# Patient Record
Sex: Male | Born: 2000
Health system: Southern US, Community
[De-identification: ages and names within clinical notes are randomized; demographics above are authoritative.]

## PROBLEM LIST (undated history)

## (undated) DIAGNOSIS — F909 Attention-deficit hyperactivity disorder, unspecified type: Secondary | ICD-10-CM

## (undated) DIAGNOSIS — J45909 Unspecified asthma, uncomplicated: Secondary | ICD-10-CM

## (undated) HISTORY — DX: Unspecified asthma, uncomplicated: J45.909

## (undated) HISTORY — DX: Attention-deficit hyperactivity disorder, unspecified type: F90.9

## (undated) HISTORY — PX: TONSILLECTOMY: SUR1361

---
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
## 2018-09-12 ENCOUNTER — Ambulatory Visit: Payer: BLUE CROSS/BLUE SHIELD | Admitting: Nurse Practitioner

## 2018-09-12 ENCOUNTER — Encounter: Payer: Self-pay | Admitting: Nurse Practitioner

## 2018-09-12 VITALS — BP 129/81 | HR 85 | Temp 97.6°F | Ht 71.0 in | Wt 279.0 lb

## 2018-09-12 DIAGNOSIS — J029 Acute pharyngitis, unspecified: Secondary | ICD-10-CM

## 2018-09-12 DIAGNOSIS — J069 Acute upper respiratory infection, unspecified: Secondary | ICD-10-CM | POA: Diagnosis not present

## 2018-09-12 LAB — RAPID STREP SCREEN (MED CTR MEBANE ONLY): Strep Gp A Ag, IA W/Reflex: NEGATIVE

## 2018-09-12 LAB — CULTURE, GROUP A STREP

## 2018-09-12 MED ORDER — AZITHROMYCIN 250 MG PO TABS
ORAL_TABLET | ORAL | 0 refills | Status: DC
Start: 2018-09-12 — End: 2020-05-07

## 2018-09-12 NOTE — Progress Notes (Signed)
Subjective:    Patient ID: Robert Phillips, male    DOB: [birth year], 18 y.o.   MRN: [id number]   Chief Complaint: sinus pressure  HPI Patient comes in today c/o sinus congestion and cough. Started about 4 days ago. No fever.   Review of Systems  Constitutional: Positive for chills. Negative for fever.  HENT: Positive for congestion, rhinorrhea and sore throat. Negative for ear pain and trouble swallowing.   Respiratory: Negative for cough.   Neurological: Positive for headaches.  All other systems reviewed and are negative.      Objective:   Physical Exam Constitutional:      General: He is not in acute distress.    Appearance: He is obese.  HENT:     Right Ear: Hearing, tympanic membrane, ear canal and external ear normal.     Left Ear: Hearing, tympanic membrane, ear canal and external ear normal.     Nose: Mucosal edema, congestion and rhinorrhea present.     Right Turbinates: Swollen and pale.     Left Turbinates: Swollen and pale.     Right Sinus: No maxillary sinus tenderness or frontal sinus tenderness.     Left Sinus: No maxillary sinus tenderness or frontal sinus tenderness.     Mouth/Throat:     Lips: Pink.     Mouth: Mucous membranes are moist.     Pharynx: Oropharynx is clear. Uvula midline.  Neck:     Musculoskeletal: Normal range of motion.  Cardiovascular:     Rate and Rhythm: Normal rate and regular rhythm.     Heart sounds: Normal heart sounds.  Pulmonary:     Effort: Pulmonary effort is normal.     Breath sounds: Normal breath sounds.  Lymphadenopathy:     Cervical: No cervical adenopathy.  Skin:    General: Skin is warm and dry.  Neurological:     General: No focal deficit present.     Mental Status: He is alert and oriented to person, place, and time.  Psychiatric:        Mood and Affect: Mood normal.        Behavior: Behavior normal.    BP 129/81   Pulse 85   Temp 97.6 F (36.4 C) (Oral)   Ht 5\' 11"  (1.803 m)   Wt 279 lb (126.6 kg)    BMI 38.91 kg/m   Strep negative     Assessment & Plan:  Alameen Misencik in today with chief complaint of Headache and Sore Throat   1. Sore throat - Rapid Strep Screen ([facility] ONLY)  2. Upper respiratory tract infection, unspecified type 1. Take meds as prescribed 2. Use a cool mist humidifier especially during the winter months and when heat has been humid. 3. Use saline nose sprays frequently 4. Saline irrigations of the nose can be very helpful if done frequently.  * 4X daily for 1 week*  * Use of a nettie pot can be helpful with this. Follow directions with this* 5. Drink plenty of fluids 6. Keep thermostat turn down low 7.For any cough or congestion  Use plain Mucinex- regular strength or max strength is fine   * Children- consult with Pharmacist for dosing 8. For fever or aces or pains- take tylenol or ibuprofen appropriate for age and weight.  * for fevers greater than 101 orally you may alternate ibuprofen and tylenol every  3 hours.   Meds ordered this encounter  Medications  . azithromycin (ZITHROMAX Z-PAK) 250 MG  tablet    Sig: As directed    Dispense:  6 tablet    Refill:  0    Order Specific Question:   Supervising Provider    Answer:   Nils Pyle [[id number]]   Mary-Margaret Daphine Deutscher, FNP

## 2018-09-12 NOTE — Patient Instructions (Signed)

## 2018-10-02 ENCOUNTER — Encounter: Payer: Self-pay | Admitting: Physician Assistant

## 2018-10-02 ENCOUNTER — Telehealth: Payer: Self-pay | Admitting: Physician Assistant

## 2018-10-02 DIAGNOSIS — J019 Acute sinusitis, unspecified: Secondary | ICD-10-CM

## 2018-10-02 MED ORDER — AMOXICILLIN-POT CLAVULANATE 875-125 MG PO TABS: 1.0000 | ORAL_TABLET | Freq: Two times a day (BID) | ORAL | 0 refills | Status: DC

## 2018-10-02 NOTE — Progress Notes (Signed)
We are sorry that you are not feeling well.  Here is how we plan to help!  Based on what you have shared with me it looks like you have sinusitis.  Sinusitis is inflammation and infection in the sinus cavities of the head.  Based on your presentation I believe you most likely have Acute Bacterial Sinusitis.  This is an infection caused by bacteria and is treated with antibiotics. I have prescribed Augmentin 875mg /125mg  one tablet twice daily with food, for 7 days.   You may use an oral decongestant such as Mucinex D or if you have glaucoma or high blood pressure use plain Mucinex. Saline nasal spray help and can safely be used as often as needed for congestion.  If you develop worsening sinus pain, fever or notice severe headache and vision changes, or if symptoms are not better after completion of antibiotic, please schedule an appointment with a health care provider.    Antibiotic supported by- According to record review, about one mouth ago had visit with PCP, diagnosed with URI and given Azithromycin. On this visit, he indicated nasal congestion and other nasal symptoms have not improved since that visit with PCP. Due to chornicity of symptoms, failure of Azithromycin, will RX antibitoicds  Sinus infections are not as easily transmitted as other respiratory infection, however we still recommend that you avoid close contact with loved ones, especially the very young and elderly.  Remember to wash your hands thoroughly throughout the day as this is the number one way to prevent the spread of infection!  Home Care:  Only take medications as instructed by your medical team.  Complete the entire course of an antibiotic.  Do not take these medications with alcohol.  A steam or ultrasonic humidifier can help congestion.  You can place a towel over your head and breathe in the steam from hot water coming from a faucet.  Avoid close contacts especially the very young and the elderly.  Cover your mouth  when you cough or sneeze.  Always remember to wash your hands.  Get Help Right Away If:  You develop worsening fever or sinus pain.  You develop a severe head ache or visual changes.  Your symptoms persist after you have completed your treatment plan.  Make sure you  Understand these instructions.  Will watch your condition.  Will get help right away if you are not doing well or get worse.  Your e-visit answers were reviewed by a board certified advanced clinical practitioner to complete your personal care plan.  Depending on the condition, your plan could have included both over the counter or prescription medications.  If there is a problem please reply  once you have received a response from your provider.  Your safety is important to [place].  If you have drug allergies check your prescription carefully.    You can use MyChart to ask questions about today's visit, request a non-urgent call back, or ask for a work or school excuse for 24 hours related to this e-Visit. If it has been greater than 24 hours you will need to follow up with your provider, or enter a new e-Visit to address those concerns.  You will get an e-mail in the next two days asking about your experience.  I hope that your e-visit has been valuable and will speed your recovery. Thank you for using e-visits.

## 2019-06-25 DIAGNOSIS — Z20828 Contact with and (suspected) exposure to other viral communicable diseases: Secondary | ICD-10-CM | POA: Diagnosis not present

## 2019-10-10 ENCOUNTER — Telehealth: Payer: Self-pay | Admitting: Nurse Practitioner

## 2019-10-10 ENCOUNTER — Encounter: Payer: Self-pay | Admitting: Family Medicine

## 2019-10-10 ENCOUNTER — Ambulatory Visit (INDEPENDENT_AMBULATORY_CARE_PROVIDER_SITE_OTHER): Payer: BC Managed Care – PPO | Admitting: Family Medicine

## 2019-10-10 DIAGNOSIS — J069 Acute upper respiratory infection, unspecified: Secondary | ICD-10-CM

## 2019-10-10 NOTE — Progress Notes (Signed)
Virtual Visit via telephone Note Due to COVID-19 pandemic this visit was conducted virtually. This visit type was conducted due to national recommendations for restrictions regarding the COVID-19 Pandemic (e.g. social distancing, sheltering in place) in an effort to limit this patient's exposure and mitigate transmission in our community. All issues noted in this document were discussed and addressed.  A physical exam was not performed with this format.   I connected with Robert Phillips on 10/10/2019 at 1345 by telephone and verified that I am speaking with the correct person using two identifiers. Robert Phillips is currently located at home and family is currently with them during visit. The provider, Kari Baars, FNP is located in their office at time of visit.  I discussed the limitations, risks, security and privacy concerns of performing an evaluation and management service by telephone and the availability of in person appointments. I also discussed with the patient that there may be a patient responsible charge related to this service. The patient expressed understanding and agreed to proceed.  Subjective:  Patient ID: Robert Phillips, male    DOB: April 13, 2001, 19 y.o.   MRN: [id number]  Chief Complaint:  URI   HPI: Robert Phillips is a 19 y.o. male presenting on 10/10/2019 for URI   URI  This is a recurrent problem. The current episode started in the past 7 days. The problem has been waxing and waning. The maximum temperature recorded prior to his arrival was 100.4 - 100.9 F. The fever has been present for 1 to 2 days. Associated symptoms include congestion, coughing, diarrhea, headaches, rhinorrhea and a sore throat. Pertinent negatives include no abdominal pain, chest pain, dysuria, ear pain, joint pain, joint swelling, nausea, neck pain, plugged ear sensation, rash, sinus pain, sneezing, swollen glands, vomiting or wheezing. He has tried acetaminophen for the symptoms. The treatment provided mild  relief.     Relevant past medical, surgical, family, and social history reviewed and updated as indicated.  Allergies and medications reviewed and updated.   Past Medical History:  Diagnosis Date  . ADHD (attention deficit hyperactivity disorder)   . Asthma     Past Surgical History:  Procedure Laterality Date  . TONSILLECTOMY      Social History   Socioeconomic History  . Marital status: Single    Spouse name: Not on file  . Number of children: Not on file  . Years of education: Not on file  . Highest education level: Not on file  Occupational History  . Not on file  Tobacco Use  . Smoking status: Passive Smoke Exposure - Never Smoker  . Smokeless tobacco: Never Used  Substance and Sexual Activity  . Alcohol use: Not on file  . Drug use: Not on file  . Sexual activity: Not on file  Other Topics Concern  . Not on file  Social History Narrative  . Not on file   Social Determinants of Health   Financial Resource Strain:   . Difficulty of Paying Living Expenses: Not on file  Food Insecurity:   . Worried About Programme researcher, broadcasting/film/video in the Last Year: Not on file  . Ran Out of Food in the Last Year: Not on file  Transportation Needs:   . Lack of Transportation (Medical): Not on file  . Lack of Transportation (Non-Medical): Not on file  Physical Activity:   . Days of Exercise per Week: Not on file  . Minutes of Exercise per Session: Not on file  Stress:   .  Feeling of Stress : Not on file  Social Connections:   . Frequency of Communication with Friends and Family: Not on file  . Frequency of Social Gatherings with Friends and Family: Not on file  . Attends Religious Services: Not on file  . Active Member of Clubs or Organizations: Not on file  . Attends Archivist Meetings: Not on file  . Marital Status: Not on file  Intimate Partner Violence:   . Fear of Current or Ex-Partner: Not on file  . Emotionally Abused: Not on file  . Physically Abused: Not  on file  . Sexually Abused: Not on file    Outpatient Encounter Medications as of 10/10/2019  Medication Sig  . amoxicillin-clavulanate (AUGMENTIN) 875-125 MG tablet Take 1 tablet by mouth 2 (two) times daily.  Marland Kitchen azithromycin (ZITHROMAX Z-PAK) 250 MG tablet As directed   No facility-administered encounter medications on file as of 10/10/2019.    No Known Allergies  Review of Systems  Constitutional: Positive for chills. Negative for activity change, appetite change, diaphoresis, fatigue, fever and unexpected weight change.  HENT: Positive for congestion, rhinorrhea and sore throat. Negative for ear pain, sinus pain and sneezing.   Eyes: Negative.  Negative for photophobia and visual disturbance.  Respiratory: Positive for cough. Negative for chest tightness, shortness of breath and wheezing.   Cardiovascular: Negative for chest pain, palpitations and leg swelling.  Gastrointestinal: Positive for diarrhea. Negative for abdominal pain, blood in stool, constipation, nausea and vomiting.  Endocrine: Negative.   Genitourinary: Negative for decreased urine volume, difficulty urinating, dysuria, frequency and urgency.  Musculoskeletal: Negative for arthralgias, joint pain, myalgias and neck pain.  Skin: Negative.  Negative for rash.  Allergic/Immunologic: Negative.   Neurological: Positive for headaches. Negative for dizziness, tremors, seizures, syncope, facial asymmetry, speech difficulty, weakness, light-headedness and numbness.  Hematological: Negative.   Psychiatric/Behavioral: Negative for confusion, hallucinations, sleep disturbance and suicidal ideas.  All other systems reviewed and are negative.        Observations/Objective: No vital signs or physical exam, this was a telephone or virtual health encounter.  Pt alert and oriented, answers all questions appropriately, and able to speak in full sentences.    Assessment and Plan: Tegh was seen today for uri.  Diagnoses and  all orders for this visit:  URI with cough and congestion Negative COVID-19 test. Viral syndrome. Symptomatic care discussed in detail.  This is a viral infection that cannot be treated with antibiotics. (Antibiotics are for bacterial infections, not viral infections.)  Viruses usually causes high fever during the first 3 days, followed by a gradual decrease in fever over the next 3-4 days. This infection will resolve through the body's defenses. Understand that fever is one of the body's primary defense mechanisms; an increased core body temperature (a fever) helps to kill germs. Symptomatic care at home should include the following:    Get plenty of rest.   Drink plenty of fluids (water, gatorade, or pedialyte), popsicles, and jello.   Take acetaminophen (Tylenol) or ibuprofen (Advil, Motrin) for fever or pain as needed.    For fever, you can also put a cool rag on the head and drink cold drinks.  A warm (not hot) bath will also be helpful.    Take honey for sore throat or to soothe an irritant cough. Can use sore throat sprays or lozenges.   Avoid spicy or acidic foods to minimize further throat irritation. For the same reason, avoid juices with citric acid.  Mucinex for cough, congestion, and sputum productin. Drink plenty of water while taking Mucinex.   Contact and droplet isolation for 5 days. Wash hands very well.  Wipe down all surfaces with sanitizer wipes at least once a day. Do not share cups or utensils.    Expect resolution in about 10 days.  If her fever increases after the 3rd day, instead of decreases, please return to the office.  If her voice starts to change and becomes very nasal sounding, then please return to the office.       Follow Up Instructions: Return if symptoms worsen or fail to improve.    I discussed the assessment and treatment plan with the patient. The patient was provided an opportunity to ask questions and all were answered. The patient agreed  with the plan and demonstrated an understanding of the instructions.   The patient was advised to call back or seek an in-person evaluation if the symptoms worsen or if the condition fails to improve as anticipated.  The above assessment and management plan was discussed with the patient. The patient verbalized understanding of and has agreed to the management plan. Patient is aware to call the clinic if they develop any new symptoms or if symptoms persist or worsen. Patient is aware when to return to the clinic for a follow-up visit. Patient educated on when it is appropriate to go to the emergency department.    I provided 15 minutes of non-face-to-face time during this encounter. The call started at 1345. The call ended at 1400. The other time was used for coordination of care.    Kari Baars, FNP-C [facility] [address] [place], [place] [address] [phone] 10/10/2019

## 2020-05-07 ENCOUNTER — Telehealth (INDEPENDENT_AMBULATORY_CARE_PROVIDER_SITE_OTHER): Payer: BC Managed Care – PPO | Admitting: Family Medicine

## 2020-05-07 ENCOUNTER — Encounter: Payer: Self-pay | Admitting: Family Medicine

## 2020-05-07 DIAGNOSIS — S90426A Blister (nonthermal), unspecified lesser toe(s), initial encounter: Secondary | ICD-10-CM | POA: Diagnosis not present

## 2020-05-07 DIAGNOSIS — S90822A Blister (nonthermal), left foot, initial encounter: Secondary | ICD-10-CM

## 2020-05-07 DIAGNOSIS — L089 Local infection of the skin and subcutaneous tissue, unspecified: Secondary | ICD-10-CM

## 2020-05-07 DIAGNOSIS — S90829A Blister (nonthermal), unspecified foot, initial encounter: Secondary | ICD-10-CM

## 2020-05-07 MED ORDER — CEPHALEXIN 500 MG PO CAPS: 500.0000 mg | ORAL_CAPSULE | Freq: Four times a day (QID) | ORAL | 0 refills | Status: AC

## 2020-05-07 NOTE — Progress Notes (Signed)
   Virtual Visit via Video note  I connected with Robert Phillips on 05/07/20 at 8:36 AM by video and verified that I am speaking with the correct person using two identifiers. Robert Phillips is currently located at home and nobody is currently with him during visit. The provider, Gwenlyn Fudge, FNP is located in their office at time of visit.  I discussed the limitations, risks, security and privacy concerns of performing an evaluation and management service by video and the availability of in person appointments. I also discussed with the patient that there may be a patient responsible charge related to this service. The patient expressed understanding and agreed to proceed.  Subjective: PCP: Bennie Pierini, FNP  Chief Complaint  Patient presents with  . Cellulitis   Patient reports he has a blister between his last two toes on the left foot that popped last Monday (9 days ago). The resulting wound is getting bigger and draining white/yellow drainage. He does report redness, warmth, and swelling.    ROS: Per HPI No current outpatient medications on file.  No Known Allergies Past Medical History:  Diagnosis Date  . ADHD (attention deficit hyperactivity disorder)   . Asthma     Observations/Objective: Physical Exam Constitutional:      General: He is not in acute distress.    Appearance: Normal appearance. He is not ill-appearing or toxic-appearing.  Eyes:     General: No scleral icterus.       Right eye: No discharge.        Left eye: No discharge.     Conjunctiva/sclera: Conjunctivae normal.  Pulmonary:     Effort: Pulmonary effort is normal. No respiratory distress.  Skin:    Findings: Wound (between last two toes of left foot with maceration, erythema, and swelling) present.  Neurological:     Mental Status: He is alert and oriented to person, place, and time.  Psychiatric:        Mood and Affect: Mood normal.        Behavior: Behavior normal.        Thought  Content: Thought content normal.        Judgment: Judgment normal.    Assessment and Plan: 1. Infected blister of foot and toe - Encouraged to keep wound clean, dry, and covered. - cephALEXin (KEFLEX) 500 MG capsule; Take 1 capsule (500 mg total) by mouth 4 (four) times daily for 7 days.  Dispense: 28 capsule; Refill: 0   Follow Up Instructions:  I discussed the assessment and treatment plan with the patient. The patient was provided an opportunity to ask questions and all were answered. The patient agreed with the plan and demonstrated an understanding of the instructions.   The patient was advised to call back or seek an in-person evaluation if the symptoms worsen or if the condition fails to improve as anticipated.  The above assessment and management plan was discussed with the patient. The patient verbalized understanding of and has agreed to the management plan. Patient is aware to call the clinic if symptoms persist or worsen. Patient is aware when to return to the clinic for a follow-up visit. Patient educated on when it is appropriate to go to the emergency department.   Time call ended: 8:45 AM  I provided 11 minutes of face-to-face time during this encounter.   Deliah Boston, MSN, APRN, FNP-C [facility] 05/07/20

## 2021-02-26 DIAGNOSIS — M549 Dorsalgia, unspecified: Secondary | ICD-10-CM | POA: Diagnosis not present

## 2021-12-25 DIAGNOSIS — H40033 Anatomical narrow angle, bilateral: Secondary | ICD-10-CM | POA: Diagnosis not present

## 2021-12-25 DIAGNOSIS — H04123 Dry eye syndrome of bilateral lacrimal glands: Secondary | ICD-10-CM | POA: Diagnosis not present

## 2022-07-27 DIAGNOSIS — M62830 Muscle spasm of back: Secondary | ICD-10-CM | POA: Diagnosis not present

## 2022-07-27 DIAGNOSIS — M9904 Segmental and somatic dysfunction of sacral region: Secondary | ICD-10-CM | POA: Diagnosis not present

## 2022-07-27 DIAGNOSIS — M9903 Segmental and somatic dysfunction of lumbar region: Secondary | ICD-10-CM | POA: Diagnosis not present

## 2022-07-27 DIAGNOSIS — M9905 Segmental and somatic dysfunction of pelvic region: Secondary | ICD-10-CM | POA: Diagnosis not present

## 2022-07-28 DIAGNOSIS — M9905 Segmental and somatic dysfunction of pelvic region: Secondary | ICD-10-CM | POA: Diagnosis not present

## 2022-07-28 DIAGNOSIS — M62830 Muscle spasm of back: Secondary | ICD-10-CM | POA: Diagnosis not present

## 2022-07-28 DIAGNOSIS — M9904 Segmental and somatic dysfunction of sacral region: Secondary | ICD-10-CM | POA: Diagnosis not present

## 2022-07-28 DIAGNOSIS — M9903 Segmental and somatic dysfunction of lumbar region: Secondary | ICD-10-CM | POA: Diagnosis not present
